# Patient Record
Sex: Female | Born: 1965 | Race: White | Hispanic: No | Marital: Married
Health system: Southern US, Community
[De-identification: ages and names within clinical notes are randomized; demographics above are authoritative.]

---

## 2000-02-03 ENCOUNTER — Other Ambulatory Visit: Admission: RE | Admit: 2000-02-03 | Discharge: 2000-02-03 | Payer: Self-pay | Admitting: Family Medicine

## 2001-02-03 ENCOUNTER — Other Ambulatory Visit: Admission: RE | Admit: 2001-02-03 | Discharge: 2001-02-03 | Payer: Self-pay | Admitting: Family Medicine

## 2006-05-20 ENCOUNTER — Encounter: Admission: RE | Admit: 2006-05-20 | Discharge: 2006-05-20 | Payer: Self-pay | Admitting: Internal Medicine

## 2006-11-05 ENCOUNTER — Encounter: Admission: RE | Admit: 2006-11-05 | Discharge: 2006-11-05 | Payer: Self-pay | Admitting: Internal Medicine

## 2007-06-17 ENCOUNTER — Encounter: Admission: RE | Admit: 2007-06-17 | Discharge: 2007-06-17 | Payer: Self-pay | Admitting: Unknown Physician Specialty

## 2008-04-27 ENCOUNTER — Encounter: Admission: RE | Admit: 2008-04-27 | Discharge: 2008-04-27 | Payer: Self-pay | Admitting: Internal Medicine

## 2009-06-14 ENCOUNTER — Encounter: Admission: RE | Admit: 2009-06-14 | Discharge: 2009-06-14 | Payer: Self-pay | Admitting: Internal Medicine

## 2010-06-27 ENCOUNTER — Other Ambulatory Visit: Payer: Self-pay | Admitting: Internal Medicine

## 2010-06-27 DIAGNOSIS — Z1231 Encounter for screening mammogram for malignant neoplasm of breast: Secondary | ICD-10-CM

## 2010-07-11 ENCOUNTER — Ambulatory Visit
Admission: RE | Admit: 2010-07-11 | Discharge: 2010-07-11 | Disposition: A | Discharge status: Home / Self Care | Payer: BC Managed Care – PPO | Source: Ambulatory Visit | Attending: Internal Medicine | Admitting: Internal Medicine

## 2010-07-11 DIAGNOSIS — Z1231 Encounter for screening mammogram for malignant neoplasm of breast: Secondary | ICD-10-CM

## 2011-10-06 ENCOUNTER — Other Ambulatory Visit: Payer: Self-pay | Admitting: Family Medicine

## 2011-10-06 DIAGNOSIS — Z1231 Encounter for screening mammogram for malignant neoplasm of breast: Secondary | ICD-10-CM

## 2011-10-31 ENCOUNTER — Ambulatory Visit
Admission: RE | Admit: 2011-10-31 | Discharge: 2011-10-31 | Disposition: A | Discharge status: Home / Self Care | Payer: BC Managed Care – PPO | Source: Ambulatory Visit | Attending: Family Medicine | Admitting: Family Medicine

## 2011-10-31 DIAGNOSIS — Z1231 Encounter for screening mammogram for malignant neoplasm of breast: Secondary | ICD-10-CM

## 2011-11-05 ENCOUNTER — Other Ambulatory Visit: Payer: Self-pay | Admitting: Family Medicine

## 2011-11-05 DIAGNOSIS — R928 Other abnormal and inconclusive findings on diagnostic imaging of breast: Secondary | ICD-10-CM

## 2011-11-13 ENCOUNTER — Ambulatory Visit
Admission: RE | Admit: 2011-11-13 | Discharge: 2011-11-13 | Disposition: A | Discharge status: Home / Self Care | Payer: BC Managed Care – PPO | Source: Ambulatory Visit | Attending: Family Medicine | Admitting: Family Medicine

## 2011-11-13 DIAGNOSIS — R928 Other abnormal and inconclusive findings on diagnostic imaging of breast: Secondary | ICD-10-CM

## 2012-04-13 ENCOUNTER — Other Ambulatory Visit: Payer: Self-pay | Admitting: Family Medicine

## 2012-04-13 DIAGNOSIS — N63 Unspecified lump in unspecified breast: Secondary | ICD-10-CM

## 2012-05-24 ENCOUNTER — Ambulatory Visit
Admission: RE | Admit: 2012-05-24 | Discharge: 2012-05-24 | Disposition: A | Discharge status: Home / Self Care | Payer: BC Managed Care – PPO | Source: Ambulatory Visit | Attending: Family Medicine | Admitting: Family Medicine

## 2012-05-24 DIAGNOSIS — N63 Unspecified lump in unspecified breast: Secondary | ICD-10-CM

## 2012-10-29 ENCOUNTER — Other Ambulatory Visit: Payer: Self-pay | Admitting: Family Medicine

## 2012-10-29 DIAGNOSIS — R922 Inconclusive mammogram: Secondary | ICD-10-CM

## 2012-11-24 ENCOUNTER — Ambulatory Visit
Admission: RE | Admit: 2012-11-24 | Discharge: 2012-11-24 | Disposition: A | Discharge status: Home / Self Care | Payer: BC Managed Care – PPO | Source: Ambulatory Visit | Attending: Family Medicine | Admitting: Family Medicine

## 2012-11-24 DIAGNOSIS — R922 Inconclusive mammogram: Secondary | ICD-10-CM

## 2013-11-03 ENCOUNTER — Other Ambulatory Visit: Payer: Self-pay | Admitting: Family Medicine

## 2013-11-03 DIAGNOSIS — N6489 Other specified disorders of breast: Secondary | ICD-10-CM

## 2013-12-12 ENCOUNTER — Ambulatory Visit
Admission: RE | Admit: 2013-12-12 | Discharge: 2013-12-12 | Disposition: A | Discharge status: Home / Self Care | Payer: Self-pay | Source: Ambulatory Visit | Attending: Family Medicine | Admitting: Family Medicine

## 2013-12-12 DIAGNOSIS — N6489 Other specified disorders of breast: Secondary | ICD-10-CM

## 2015-01-04 ENCOUNTER — Other Ambulatory Visit: Payer: Self-pay

## 2015-01-04 DIAGNOSIS — Z1231 Encounter for screening mammogram for malignant neoplasm of breast: Secondary | ICD-10-CM

## 2015-02-12 ENCOUNTER — Ambulatory Visit
Admission: RE | Admit: 2015-02-12 | Discharge: 2015-02-12 | Disposition: A | Discharge status: Home / Self Care | Payer: Commercial Managed Care - PPO | Source: Ambulatory Visit

## 2015-02-12 DIAGNOSIS — Z1231 Encounter for screening mammogram for malignant neoplasm of breast: Secondary | ICD-10-CM

## 2016-03-20 ENCOUNTER — Other Ambulatory Visit: Payer: Self-pay | Admitting: Nurse Practitioner

## 2016-03-20 DIAGNOSIS — Z1231 Encounter for screening mammogram for malignant neoplasm of breast: Secondary | ICD-10-CM

## 2016-04-14 ENCOUNTER — Ambulatory Visit
Admission: RE | Admit: 2016-04-14 | Discharge: 2016-04-14 | Disposition: A | Discharge status: Home / Self Care | Payer: Commercial Managed Care - PPO | Source: Ambulatory Visit | Attending: Nurse Practitioner | Admitting: Nurse Practitioner

## 2016-04-14 DIAGNOSIS — Z1231 Encounter for screening mammogram for malignant neoplasm of breast: Secondary | ICD-10-CM

## 2017-07-20 ENCOUNTER — Other Ambulatory Visit: Payer: Self-pay | Admitting: Nurse Practitioner

## 2017-07-20 DIAGNOSIS — Z1231 Encounter for screening mammogram for malignant neoplasm of breast: Secondary | ICD-10-CM

## 2017-08-17 ENCOUNTER — Ambulatory Visit
Admission: RE | Admit: 2017-08-17 | Discharge: 2017-08-17 | Disposition: A | Discharge status: Home / Self Care | Payer: Commercial Managed Care - PPO | Source: Ambulatory Visit | Attending: Nurse Practitioner | Admitting: Nurse Practitioner

## 2017-08-17 DIAGNOSIS — Z1231 Encounter for screening mammogram for malignant neoplasm of breast: Secondary | ICD-10-CM

## 2017-08-18 ENCOUNTER — Other Ambulatory Visit: Payer: Self-pay | Admitting: Nurse Practitioner

## 2017-08-18 DIAGNOSIS — R928 Other abnormal and inconclusive findings on diagnostic imaging of breast: Secondary | ICD-10-CM

## 2017-08-21 ENCOUNTER — Ambulatory Visit: Payer: Commercial Managed Care - PPO

## 2017-08-21 ENCOUNTER — Ambulatory Visit
Admission: RE | Admit: 2017-08-21 | Discharge: 2017-08-21 | Disposition: A | Discharge status: Home / Self Care | Payer: Commercial Managed Care - PPO | Source: Ambulatory Visit | Attending: Nurse Practitioner | Admitting: Nurse Practitioner

## 2017-08-21 DIAGNOSIS — R928 Other abnormal and inconclusive findings on diagnostic imaging of breast: Secondary | ICD-10-CM

## 2018-11-23 ENCOUNTER — Other Ambulatory Visit: Payer: Self-pay | Admitting: Nurse Practitioner

## 2018-11-23 DIAGNOSIS — Z1231 Encounter for screening mammogram for malignant neoplasm of breast: Secondary | ICD-10-CM

## 2019-01-03 ENCOUNTER — Ambulatory Visit
Admission: RE | Admit: 2019-01-03 | Discharge: 2019-01-03 | Disposition: A | Discharge status: Home / Self Care | Payer: Commercial Managed Care - PPO | Source: Ambulatory Visit | Attending: Nurse Practitioner | Admitting: Nurse Practitioner

## 2019-01-03 ENCOUNTER — Other Ambulatory Visit: Payer: Self-pay

## 2019-01-03 DIAGNOSIS — Z1231 Encounter for screening mammogram for malignant neoplasm of breast: Secondary | ICD-10-CM

## 2020-01-06 ENCOUNTER — Other Ambulatory Visit: Payer: Self-pay | Admitting: Nurse Practitioner

## 2020-01-06 DIAGNOSIS — Z1231 Encounter for screening mammogram for malignant neoplasm of breast: Secondary | ICD-10-CM

## 2020-01-27 ENCOUNTER — Other Ambulatory Visit: Payer: Self-pay

## 2020-01-27 ENCOUNTER — Ambulatory Visit
Admission: RE | Admit: 2020-01-27 | Discharge: 2020-01-27 | Disposition: A | Discharge status: Home / Self Care | Payer: Commercial Managed Care - PPO | Source: Ambulatory Visit | Attending: Nurse Practitioner | Admitting: Nurse Practitioner

## 2020-01-27 DIAGNOSIS — Z1231 Encounter for screening mammogram for malignant neoplasm of breast: Secondary | ICD-10-CM

## 2020-12-19 ENCOUNTER — Other Ambulatory Visit: Payer: Self-pay | Admitting: Nurse Practitioner

## 2020-12-19 DIAGNOSIS — Z1231 Encounter for screening mammogram for malignant neoplasm of breast: Secondary | ICD-10-CM

## 2021-01-28 ENCOUNTER — Ambulatory Visit: Payer: Commercial Managed Care - PPO

## 2021-01-28 ENCOUNTER — Ambulatory Visit
Admission: RE | Admit: 2021-01-28 | Discharge: 2021-01-28 | Disposition: A | Discharge status: Home / Self Care | Payer: Commercial Managed Care - PPO | Source: Ambulatory Visit | Attending: Nurse Practitioner | Admitting: Nurse Practitioner

## 2021-01-28 ENCOUNTER — Other Ambulatory Visit: Payer: Self-pay

## 2021-01-28 DIAGNOSIS — Z1231 Encounter for screening mammogram for malignant neoplasm of breast: Secondary | ICD-10-CM

## 2021-02-01 ENCOUNTER — Other Ambulatory Visit: Payer: Self-pay | Admitting: Nurse Practitioner

## 2021-02-01 DIAGNOSIS — R928 Other abnormal and inconclusive findings on diagnostic imaging of breast: Secondary | ICD-10-CM

## 2021-02-05 ENCOUNTER — Other Ambulatory Visit: Payer: Self-pay | Admitting: Obstetrics and Gynecology

## 2021-02-05 DIAGNOSIS — R928 Other abnormal and inconclusive findings on diagnostic imaging of breast: Secondary | ICD-10-CM

## 2021-02-06 ENCOUNTER — Other Ambulatory Visit: Payer: Self-pay

## 2021-02-06 ENCOUNTER — Ambulatory Visit
Admission: RE | Admit: 2021-02-06 | Discharge: 2021-02-06 | Disposition: A | Discharge status: Home / Self Care | Payer: Commercial Managed Care - PPO | Source: Ambulatory Visit | Attending: Nurse Practitioner | Admitting: Nurse Practitioner

## 2021-02-06 DIAGNOSIS — R928 Other abnormal and inconclusive findings on diagnostic imaging of breast: Secondary | ICD-10-CM

## 2021-12-31 ENCOUNTER — Other Ambulatory Visit: Payer: Self-pay | Admitting: Family Medicine

## 2021-12-31 DIAGNOSIS — Z1231 Encounter for screening mammogram for malignant neoplasm of breast: Secondary | ICD-10-CM

## 2022-01-31 ENCOUNTER — Ambulatory Visit: Payer: Commercial Managed Care - PPO

## 2022-03-14 ENCOUNTER — Ambulatory Visit
Admission: RE | Admit: 2022-03-14 | Discharge: 2022-03-14 | Disposition: A | Discharge status: Home / Self Care | Payer: Commercial Managed Care - PPO | Source: Ambulatory Visit | Attending: Family Medicine | Admitting: Family Medicine

## 2022-03-14 DIAGNOSIS — Z1231 Encounter for screening mammogram for malignant neoplasm of breast: Secondary | ICD-10-CM

## 2023-02-12 IMAGING — MG MM DIGITAL DIAGNOSTIC UNILAT*R* W/ TOMO W/ CAD
6 series · 6 of 18 positions shown · non-contrast
Comparison: Previous exam(s).

CLINICAL DATA: 55-year-old female presenting as a recall from
screening for possible right breast mass.

EXAM:
DIGITAL DIAGNOSTIC UNILATERAL RIGHT MAMMOGRAM WITH TOMOSYNTHESIS AND
CAD; ULTRASOUND RIGHT BREAST LIMITED
TECHNIQUE: Right digital diagnostic mammography and breast tomosynthesis was
performed. The images were evaluated with computer-aided detection.;
Targeted ultrasound examination of the right breast was performed

[R CC synth-2D (1 of 2)]
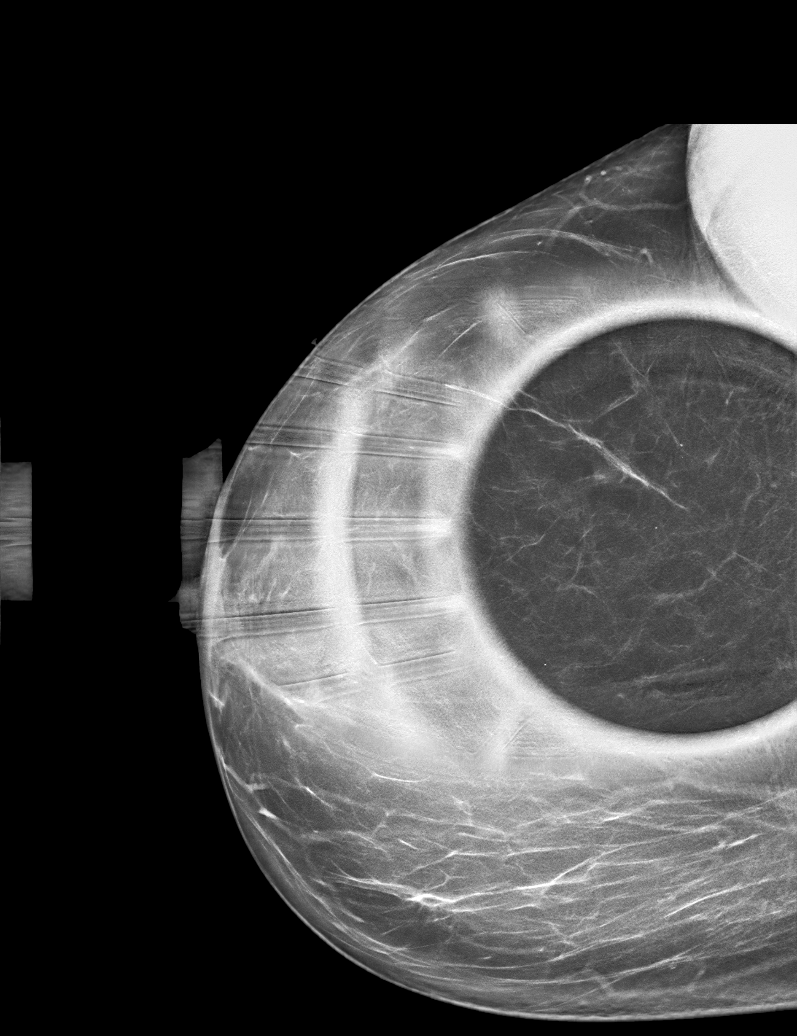

[R MLO synth-2D]
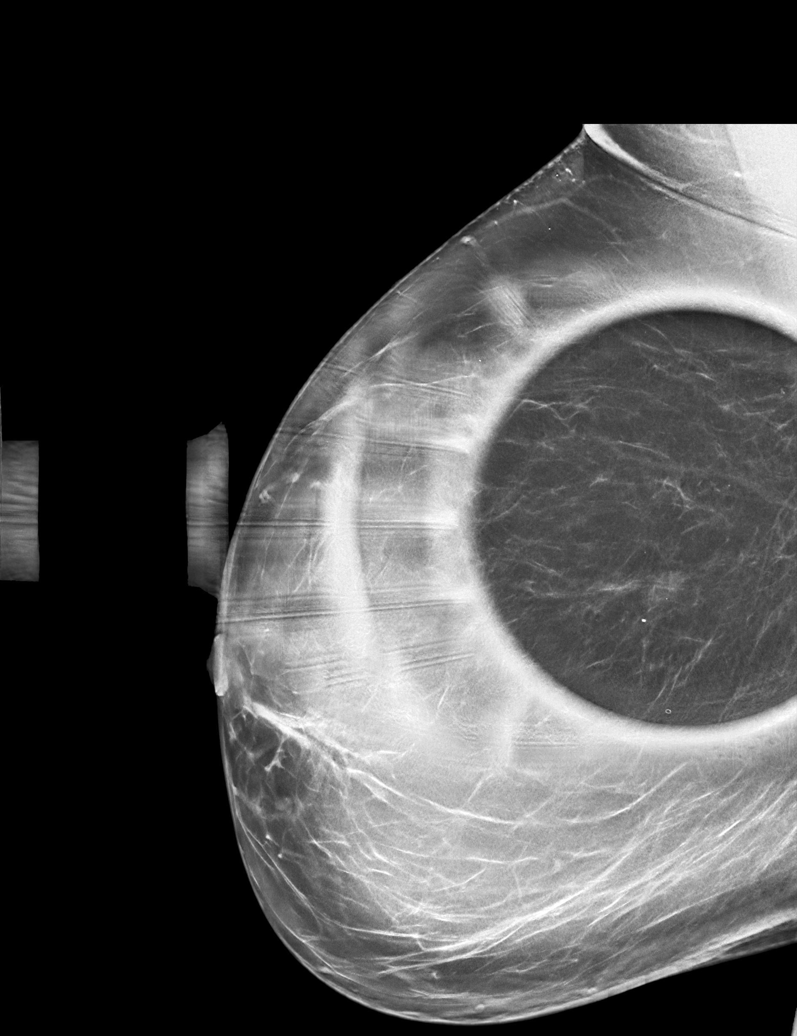

[R CC synth-2D (2 of 2)]
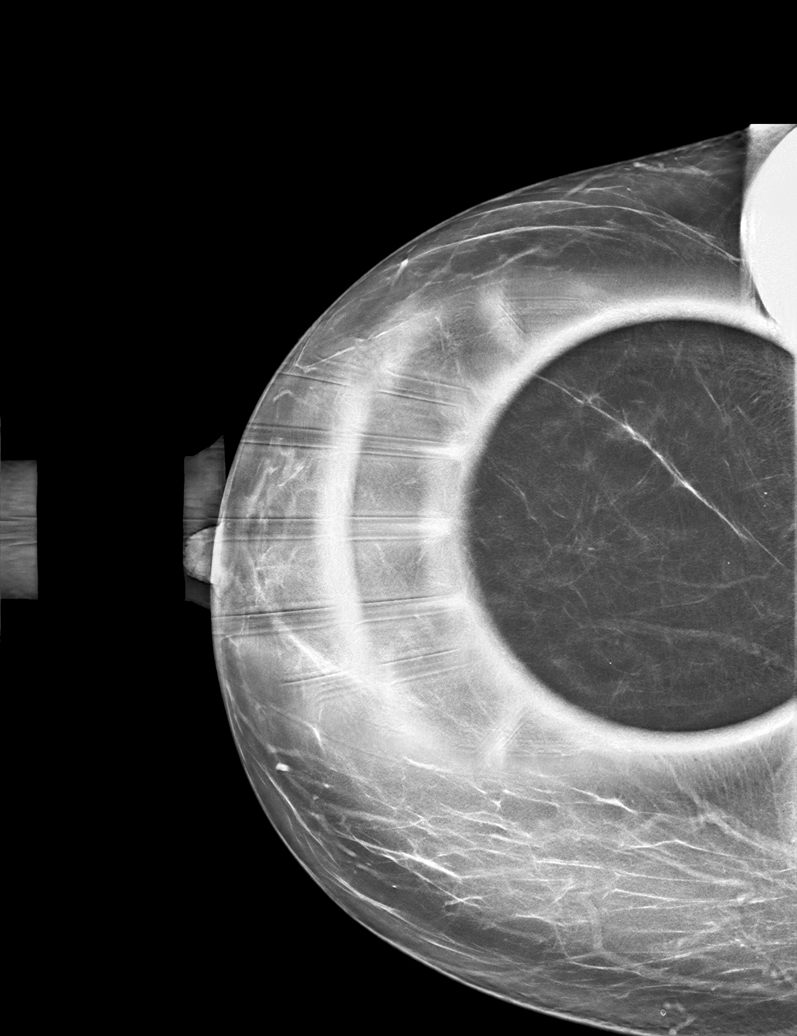

[R CC tomo (1 of 2) · tomo slice 34/67.0]
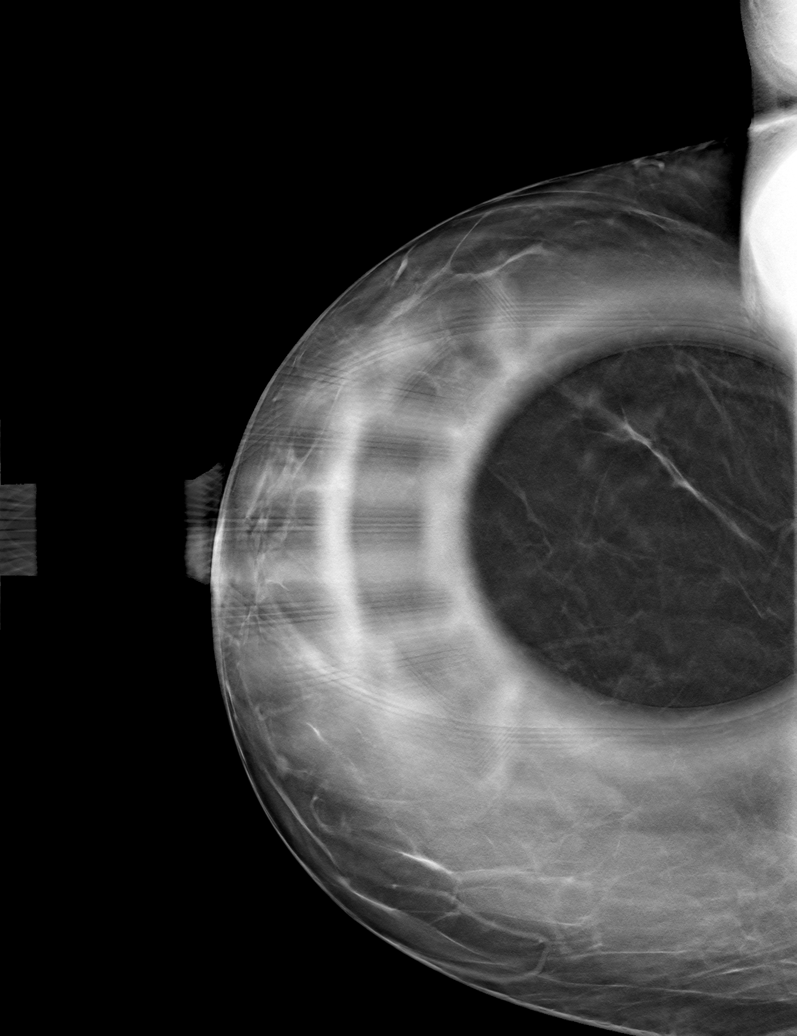

[R CC tomo (2 of 2) · tomo slice 36/71.0]
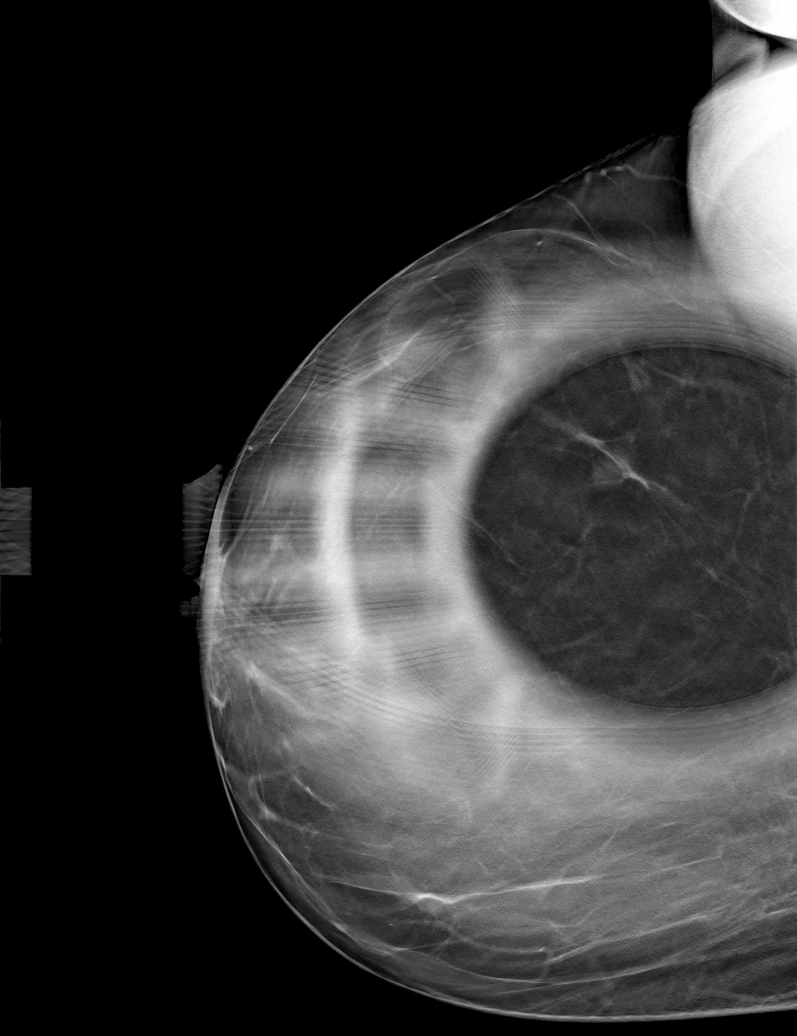

[R MLO tomo · tomo slice 36/71.0]
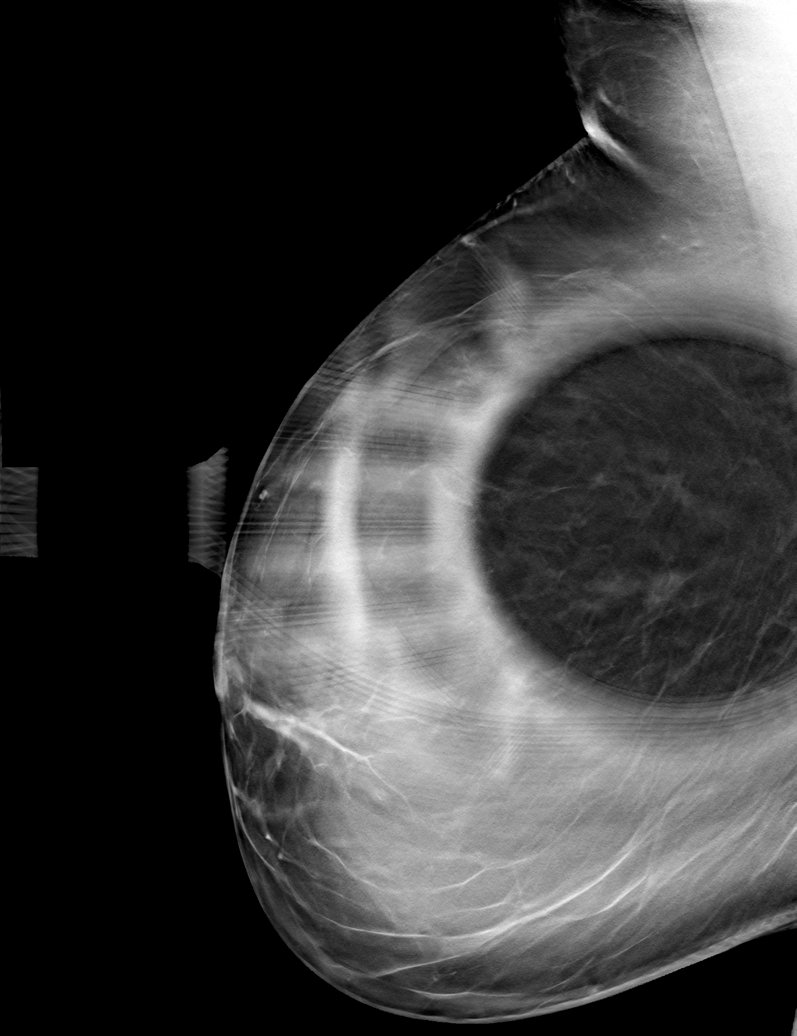

[6 of 18 positions shown; findings below may reference images not displayed]

ACR Breast Density Category b: There are scattered areas of
fibroglandular density.
FINDINGS: Mammogram:

Spot compression tomosynthesis views of the right breast were
performed demonstrating persistence of an oval mass in the outer
right breast posterior depth measuring approximately 0.9 cm.

Ultrasound:

Targeted ultrasound performed in the right breast at 10 o'clock 9 cm
from the nipple demonstrating an oval circumscribed anechoic mass
measuring 0.9 x 0.3 x 1.0 cm, consistent with a benign cyst. This
corresponds to the mammographic finding.
IMPRESSION: Benign cyst in the right breast at 10 o'clock.

RECOMMENDATION:
Screening mammogram in one year.(Code:CZ-Z-50X)

I have discussed the findings and recommendations with the patient.
If applicable, a reminder letter will be sent to the patient
regarding the next appointment.

BI-RADS CATEGORY  2: Benign.

## 2023-02-12 IMAGING — US US BREAST*R* LIMITED INC AXILLA
1 series · 5 of 5 positions shown · non-contrast
Comparison: Previous exam(s).

CLINICAL DATA: 55-year-old female presenting as a recall from
screening for possible right breast mass.

EXAM:
DIGITAL DIAGNOSTIC UNILATERAL RIGHT MAMMOGRAM WITH TOMOSYNTHESIS AND
CAD; ULTRASOUND RIGHT BREAST LIMITED
TECHNIQUE: Right digital diagnostic mammography and breast tomosynthesis was
performed. The images were evaluated with computer-aided detection.;
Targeted ultrasound examination of the right breast was performed

[Series 1: us breast*right* limited inc axilla · 0.06mm/px · 5 of 5 slices shown]
[im 1/5]
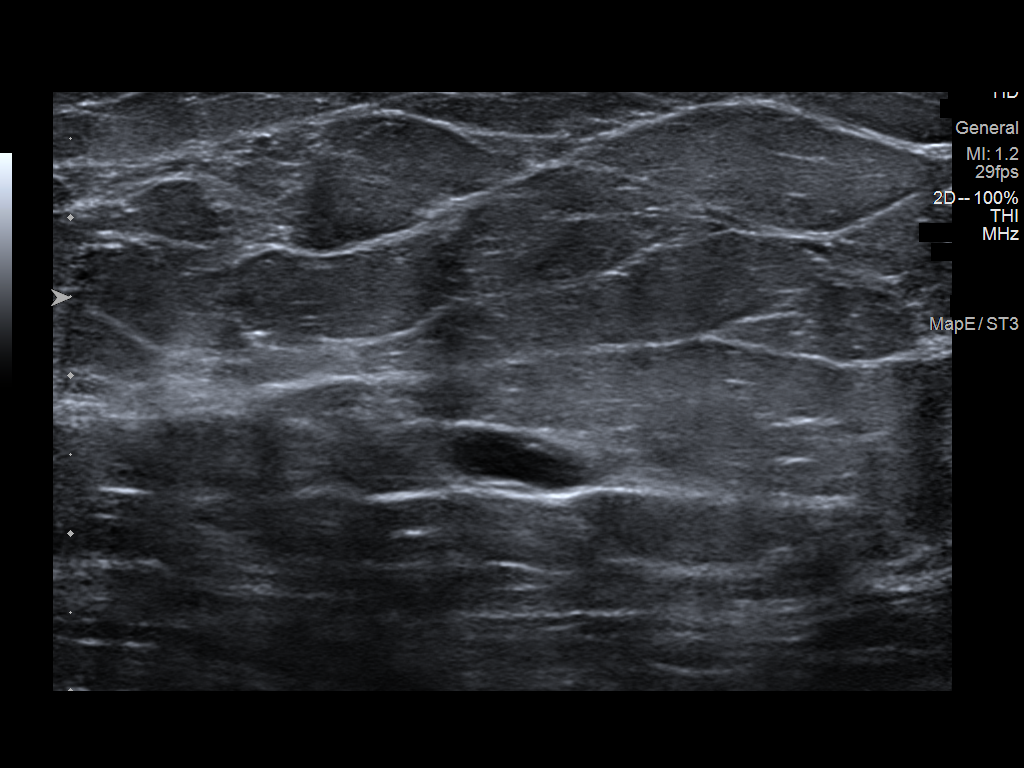
[im 2/5]
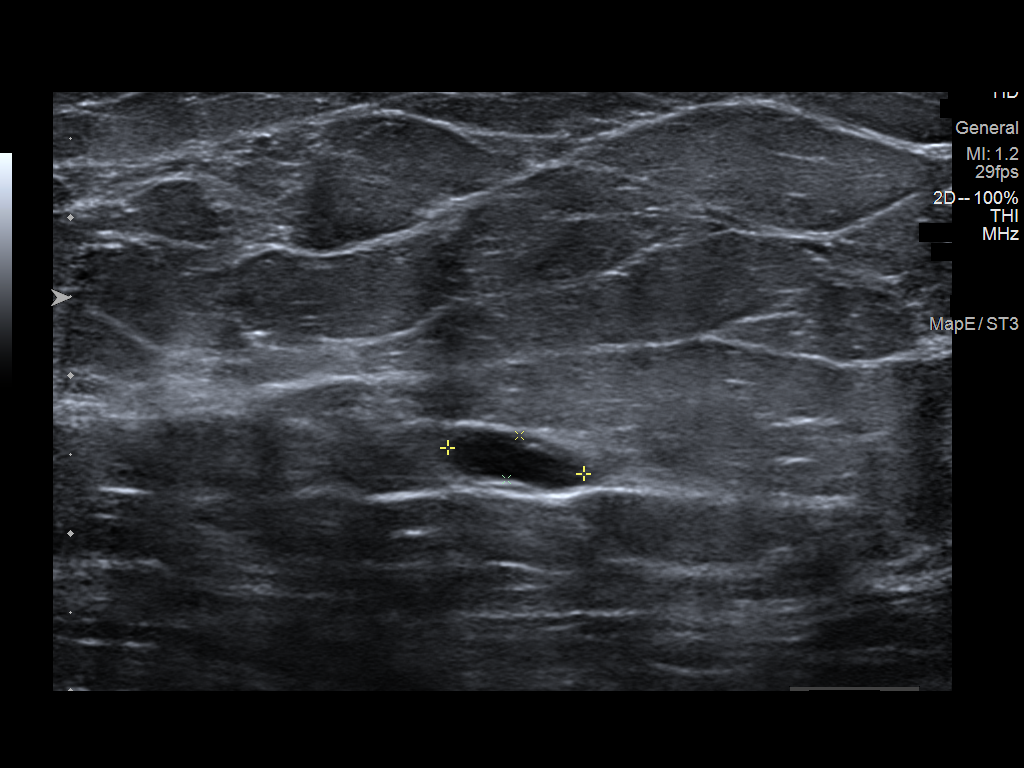
[im 3/5]
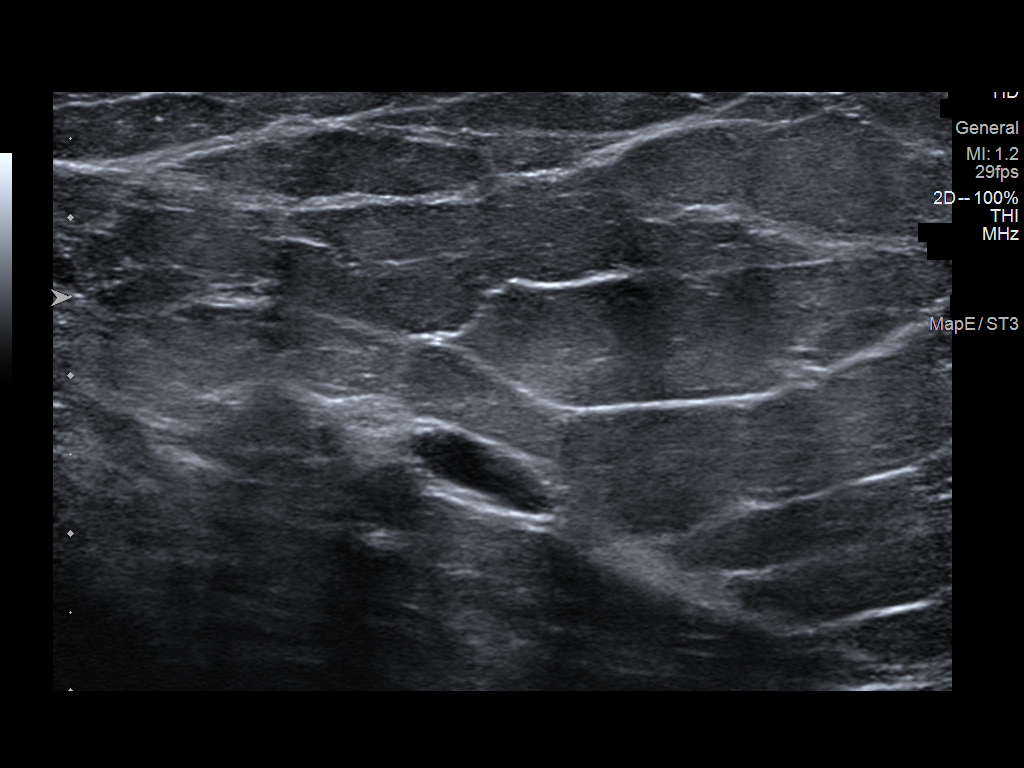
[im 4/5]
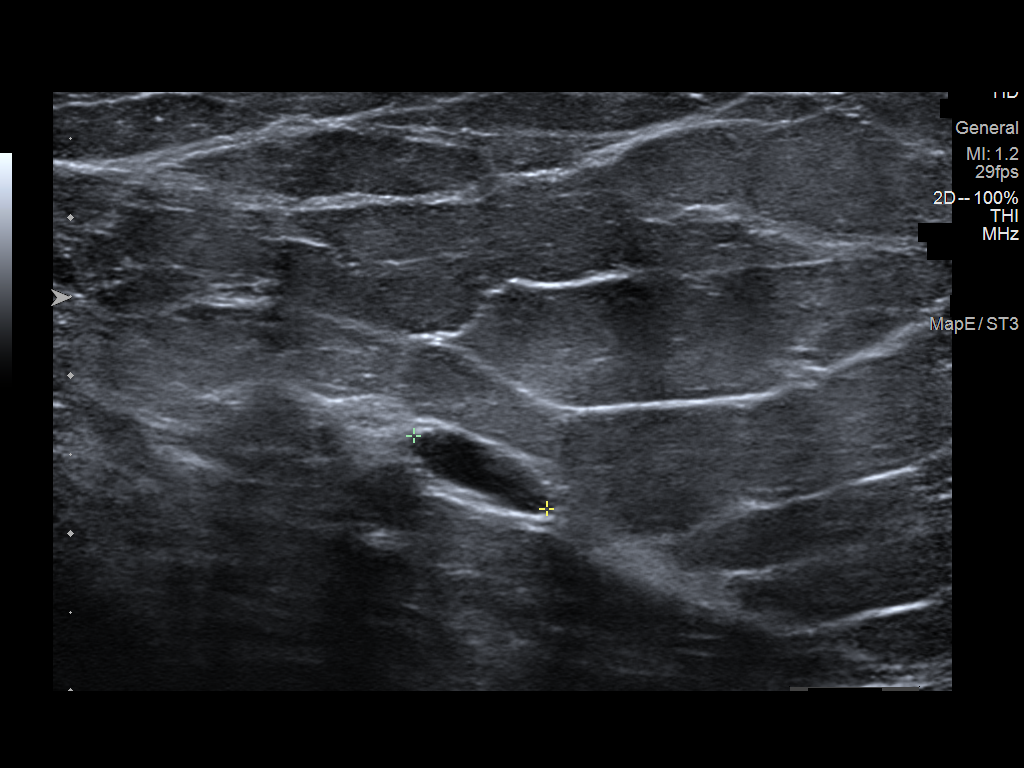
[im 5/5]
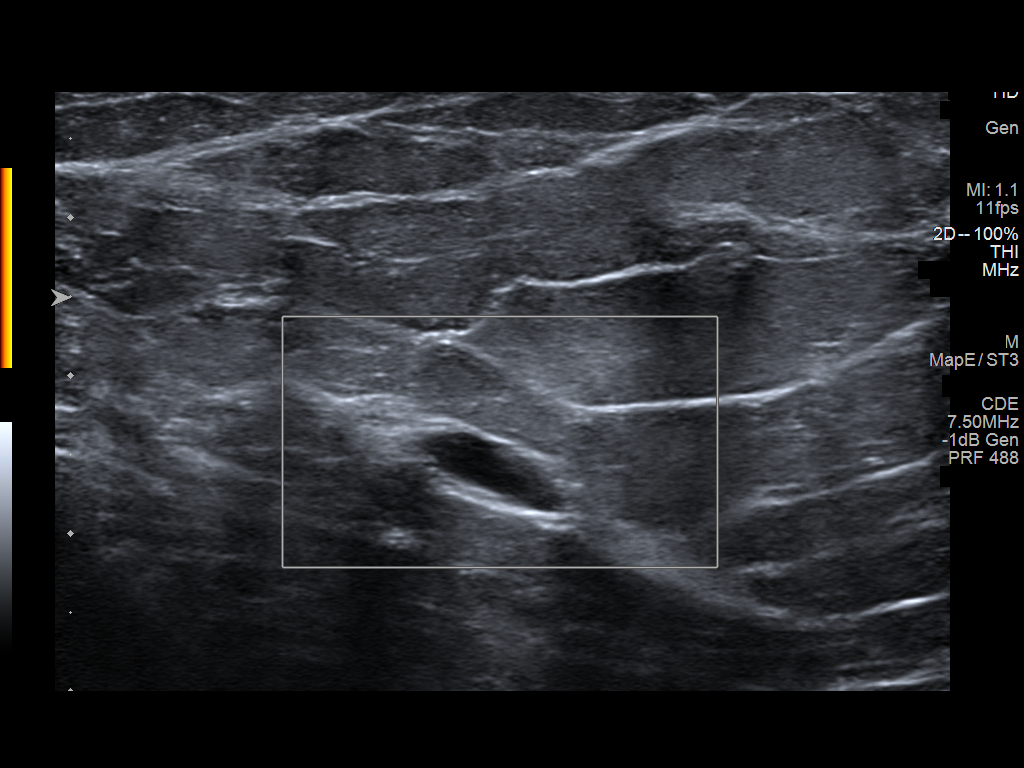

[5 of 5 positions shown; findings below may reference images not displayed]

ACR Breast Density Category b: There are scattered areas of
fibroglandular density.
FINDINGS: Mammogram:

Spot compression tomosynthesis views of the right breast were
performed demonstrating persistence of an oval mass in the outer
right breast posterior depth measuring approximately 0.9 cm.

Ultrasound:

Targeted ultrasound performed in the right breast at 10 o'clock 9 cm
from the nipple demonstrating an oval circumscribed anechoic mass
measuring 0.9 x 0.3 x 1.0 cm, consistent with a benign cyst. This
corresponds to the mammographic finding.
IMPRESSION: Benign cyst in the right breast at 10 o'clock.

RECOMMENDATION:
Screening mammogram in one year.(Code:[id number]-50X)

I have discussed the findings and recommendations with the patient.
If applicable, a reminder letter will be sent to the patient
regarding the next appointment.

BI-RADS CATEGORY  2: Benign.

## 2023-03-02 ENCOUNTER — Other Ambulatory Visit: Payer: Self-pay | Admitting: Family Medicine

## 2023-03-02 DIAGNOSIS — Z1231 Encounter for screening mammogram for malignant neoplasm of breast: Secondary | ICD-10-CM

## 2023-04-13 ENCOUNTER — Ambulatory Visit
Admission: RE | Admit: 2023-04-13 | Discharge: 2023-04-13 | Disposition: A | Discharge status: Home / Self Care | Payer: BC Managed Care – PPO | Source: Ambulatory Visit | Attending: Family Medicine | Admitting: Family Medicine

## 2023-04-13 DIAGNOSIS — Z1231 Encounter for screening mammogram for malignant neoplasm of breast: Secondary | ICD-10-CM

## 2024-03-09 ENCOUNTER — Other Ambulatory Visit: Payer: Self-pay | Admitting: Family Medicine

## 2024-03-09 DIAGNOSIS — Z1231 Encounter for screening mammogram for malignant neoplasm of breast: Secondary | ICD-10-CM

## 2024-04-13 ENCOUNTER — Ambulatory Visit
Admission: RE | Admit: 2024-04-13 | Discharge: 2024-04-13 | Disposition: A | Discharge status: Home / Self Care | Source: Ambulatory Visit | Attending: Family Medicine | Admitting: Family Medicine

## 2024-04-13 DIAGNOSIS — Z1231 Encounter for screening mammogram for malignant neoplasm of breast: Secondary | ICD-10-CM
